# Patient Record
Sex: Male | Born: 1975 | Hispanic: No | Marital: Single | Smoking: Current some day smoker
Health system: Southern US, Community
[De-identification: ages and names within clinical notes are randomized; demographics above are authoritative.]

---

## 2005-07-15 ENCOUNTER — Emergency Department (HOSPITAL_COMMUNITY): Admission: EM | Admit: 2005-07-15 | Discharge: 2005-07-15 | Payer: Self-pay | Admitting: Emergency Medicine

## 2017-02-05 ENCOUNTER — Encounter (HOSPITAL_COMMUNITY): Payer: Self-pay | Admitting: Emergency Medicine

## 2017-02-05 ENCOUNTER — Emergency Department (HOSPITAL_COMMUNITY): Payer: No Typology Code available for payment source

## 2017-02-05 ENCOUNTER — Emergency Department (HOSPITAL_COMMUNITY)
Admission: EM | Admit: 2017-02-05 | Discharge: 2017-02-05 | Disposition: A | Discharge status: Home / Self Care | Payer: No Typology Code available for payment source | Attending: Emergency Medicine | Admitting: Emergency Medicine

## 2017-02-05 DIAGNOSIS — Y9389 Activity, other specified: Secondary | ICD-10-CM | POA: Diagnosis not present

## 2017-02-05 DIAGNOSIS — F17200 Nicotine dependence, unspecified, uncomplicated: Secondary | ICD-10-CM | POA: Diagnosis not present

## 2017-02-05 DIAGNOSIS — S63501A Unspecified sprain of right wrist, initial encounter: Secondary | ICD-10-CM

## 2017-02-05 DIAGNOSIS — Y92410 Unspecified street and highway as the place of occurrence of the external cause: Secondary | ICD-10-CM | POA: Diagnosis not present

## 2017-02-05 DIAGNOSIS — S6991XA Unspecified injury of right wrist, hand and finger(s), initial encounter: Secondary | ICD-10-CM | POA: Diagnosis present

## 2017-02-05 DIAGNOSIS — M25421 Effusion, right elbow: Secondary | ICD-10-CM

## 2017-02-05 DIAGNOSIS — Y999 Unspecified external cause status: Secondary | ICD-10-CM | POA: Insufficient documentation

## 2017-02-05 MED ORDER — HYDROCODONE-ACETAMINOPHEN 5-325 MG PO TABS: 2.0000 | ORAL_TABLET | ORAL | 0 refills | Status: AC | PRN

## 2017-02-05 MED ORDER — IBUPROFEN 800 MG PO TABS: 800.0000 mg | ORAL_TABLET | Freq: Three times a day (TID) | ORAL | 0 refills | Status: AC
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
## 2017-02-05 NOTE — Discharge Instructions (Signed)
Return if any problems.  Schedule to see Dr. Eulah PontMurphy for recheck in 1 week. Ice to area of pain

## 2017-02-05 NOTE — ED Triage Notes (Signed)
Called to treatment area-no one responded

## 2017-02-11 ENCOUNTER — Other Ambulatory Visit: Payer: Self-pay | Admitting: Orthopedic Surgery

## 2017-02-11 DIAGNOSIS — M25521 Pain in right elbow: Secondary | ICD-10-CM

## 2017-02-13 ENCOUNTER — Ambulatory Visit
Admission: RE | Admit: 2017-02-13 | Discharge: 2017-02-13 | Disposition: A | Discharge status: Home / Self Care | Payer: Federal, State, Local not specified - PPO | Source: Ambulatory Visit | Attending: Orthopedic Surgery | Admitting: Orthopedic Surgery

## 2017-02-13 DIAGNOSIS — M25521 Pain in right elbow: Secondary | ICD-10-CM

## 2017-09-29 IMAGING — CT CT ELBOW*R* W/O CM
3 series · 13 of 20 positions shown, 14 images · non-contrast
Comparison: Radiographs dated 02/05/2017

CLINICAL DATA: Pain and swelling of the right elbow. Elbow
effusion.

EXAM:
CT OF THE LOWER RIGHT EXTREMITY WITHOUT CONTRAST
TECHNIQUE: Multidetector CT imaging of the right lower extremity was performed
according to the standard protocol.

[Series 3: ext soft · axial · 0.27mm/px · z∈[+56,+200]mm · 6 of 102 slices shown]
[im 15/102  soft-tissue]
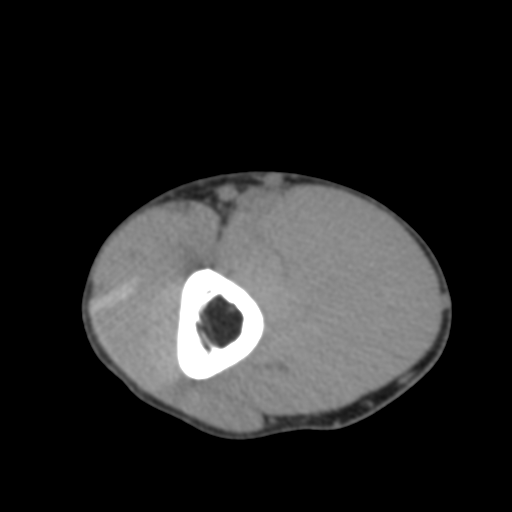
[im 29/102  soft-tissue]
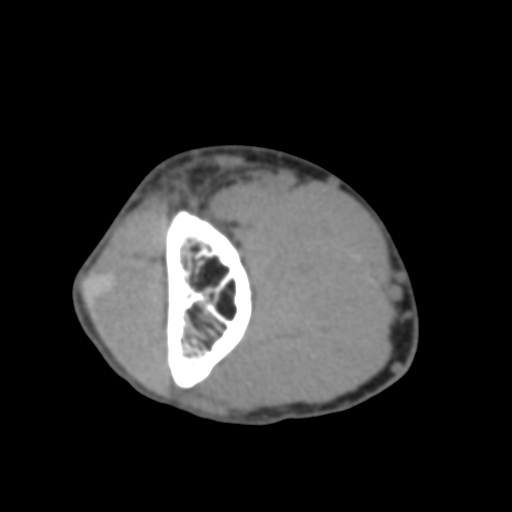
[im 44/102  soft-tissue]
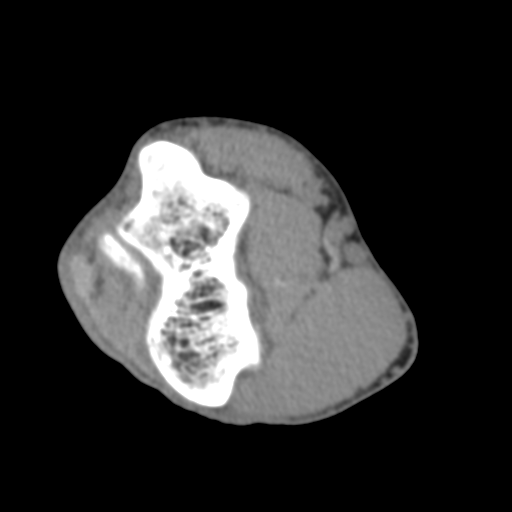
[im 58/102  soft-tissue]
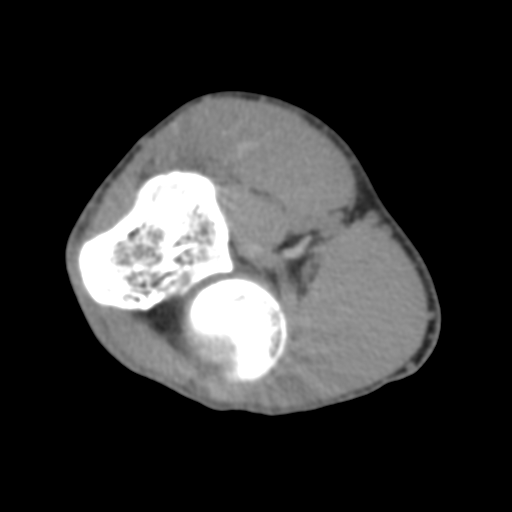
[im 73/102  soft-tissue]
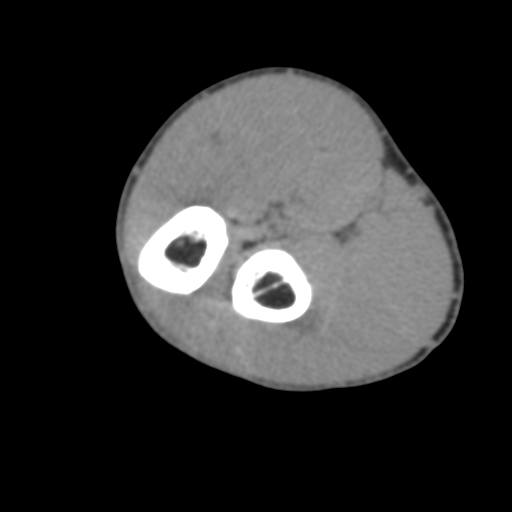
[im 87/102  soft-tissue]
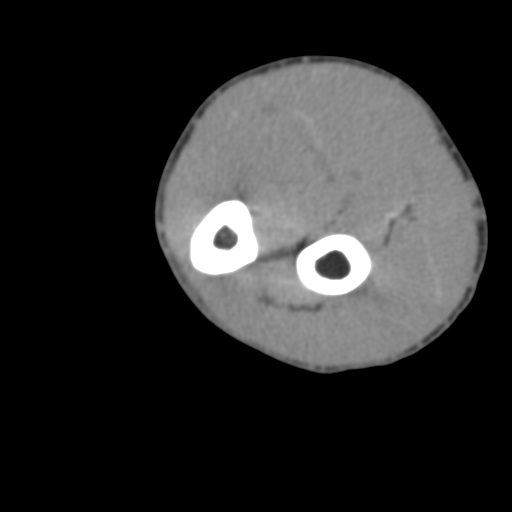

[Series 602: axial · axial · 0.40mm/px · z∈[+97,+187]mm · 4 of 77 slices shown, 5 images]
[im 16/77  soft-tissue]
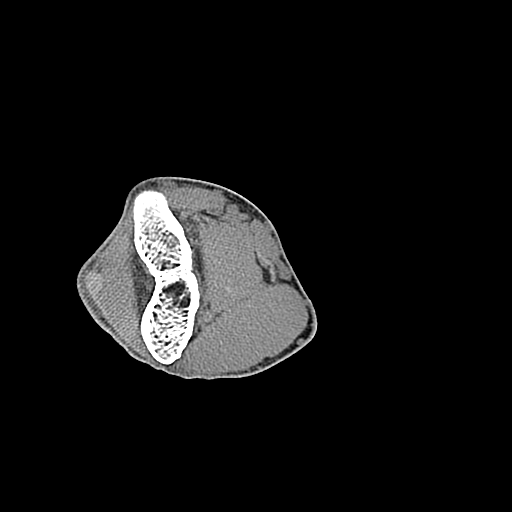
[im 16/77  bone]
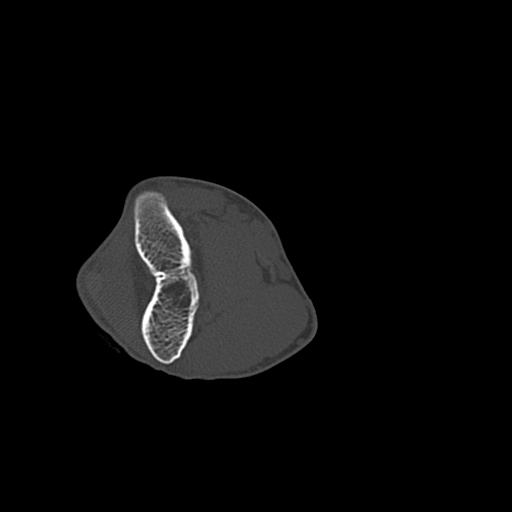
[im 31/77  bone]
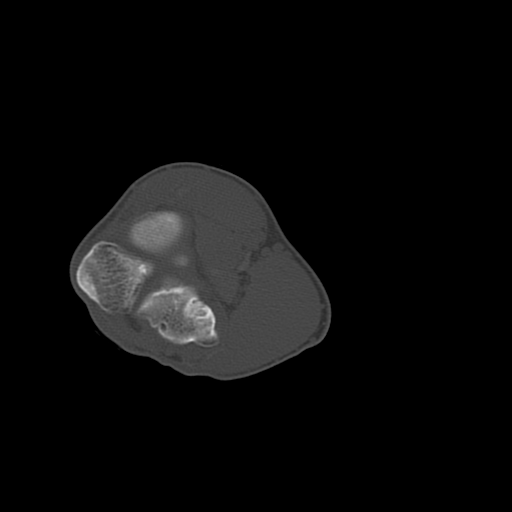
[im 46/77  bone]
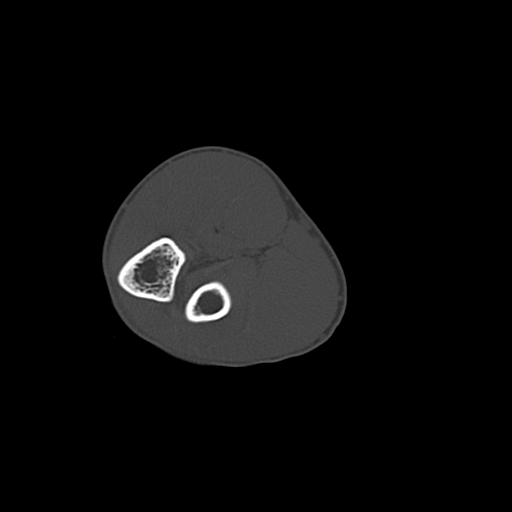
[im 61/77  bone]
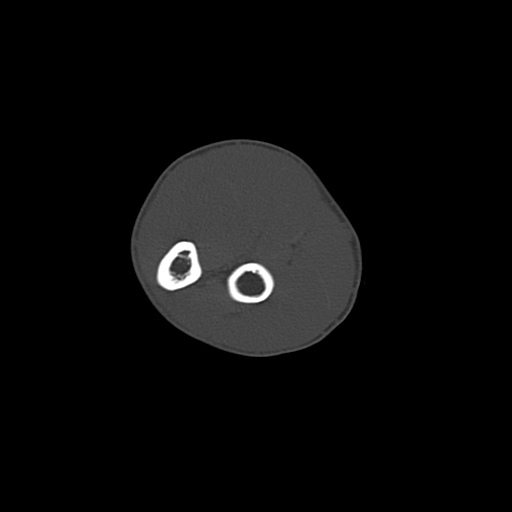

[Series 603: sag bone · coronal · 0.40mm/px · 3 of 60 slices shown]
[im 12/60  bone]
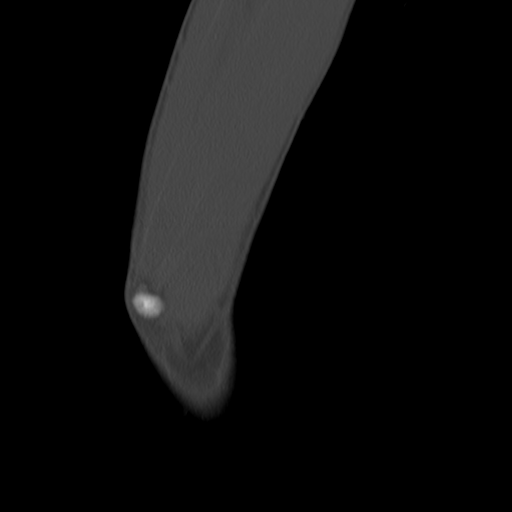
[im 24/60  bone]
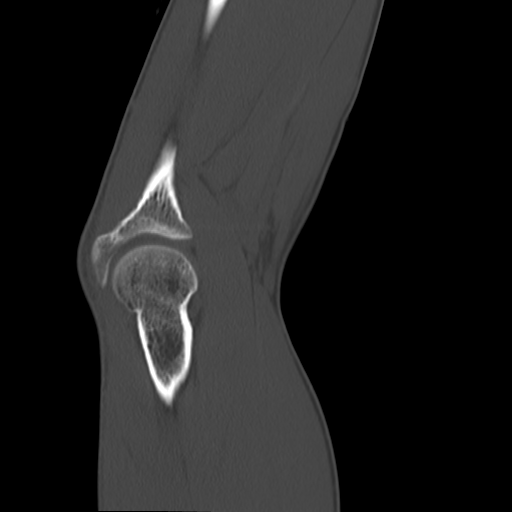
[im 36/60  bone]
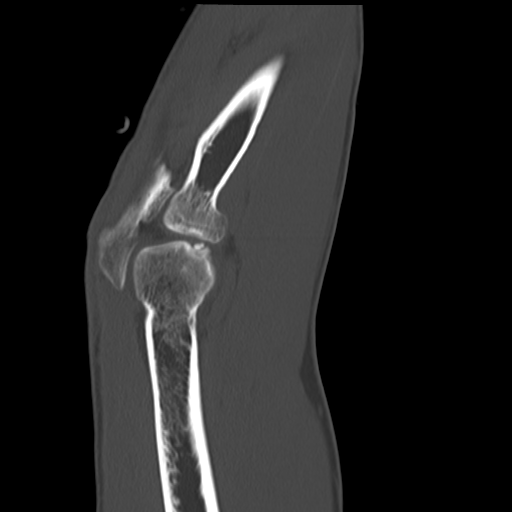

[13 of 20 positions shown; findings below may reference images not displayed]

FINDINGS: Bones/Joint/Cartilage

There is a hairline fracture of the articular surface of the radial
head best seen on image 45 and 46 of series 4. There is no
displacement or impaction.

There is no old osteochondral lesion of the capitellum with a
nondisplaced 6 mm osteochondral fragment. There are no loose bodies
in the joint.

No other abnormalities.
IMPRESSION: 1. Nondisplaced hairline fracture of the articular surface of the
radial head.
2. Tiny osteochondral lesion of the capitellum without displacement.
3. No loose bodies in the joint.
